# Patient Record
Sex: Female | Born: 1965 | Race: White | Hispanic: No | Smoking: Never smoker
Health system: Southern US, Community
[De-identification: ages and names within clinical notes are randomized; demographics above are authoritative.]

## PROBLEM LIST (undated history)

## (undated) DIAGNOSIS — N179 Acute kidney failure, unspecified: Secondary | ICD-10-CM

## (undated) DIAGNOSIS — K90829 Short bowel syndrome, unspecified: Secondary | ICD-10-CM

## (undated) DIAGNOSIS — F429 Obsessive-compulsive disorder, unspecified: Secondary | ICD-10-CM

## (undated) DIAGNOSIS — K439 Ventral hernia without obstruction or gangrene: Secondary | ICD-10-CM

## (undated) DIAGNOSIS — R748 Abnormal levels of other serum enzymes: Secondary | ICD-10-CM

## (undated) DIAGNOSIS — S31109A Unspecified open wound of abdominal wall, unspecified quadrant without penetration into peritoneal cavity, initial encounter: Secondary | ICD-10-CM

## (undated) DIAGNOSIS — K912 Postsurgical malabsorption, not elsewhere classified: Secondary | ICD-10-CM

## (undated) DIAGNOSIS — R768 Other specified abnormal immunological findings in serum: Secondary | ICD-10-CM

## (undated) DIAGNOSIS — K909 Intestinal malabsorption, unspecified: Secondary | ICD-10-CM

## (undated) DIAGNOSIS — K219 Gastro-esophageal reflux disease without esophagitis: Secondary | ICD-10-CM

## (undated) DIAGNOSIS — R519 Headache, unspecified: Secondary | ICD-10-CM

## (undated) DIAGNOSIS — Z8719 Personal history of other diseases of the digestive system: Secondary | ICD-10-CM

## (undated) DIAGNOSIS — N1330 Unspecified hydronephrosis: Secondary | ICD-10-CM

## (undated) DIAGNOSIS — G8929 Other chronic pain: Secondary | ICD-10-CM

## (undated) DIAGNOSIS — F319 Bipolar disorder, unspecified: Secondary | ICD-10-CM

## (undated) DIAGNOSIS — K56609 Unspecified intestinal obstruction, unspecified as to partial versus complete obstruction: Secondary | ICD-10-CM

## (undated) DIAGNOSIS — K5900 Constipation, unspecified: Secondary | ICD-10-CM

## (undated) DIAGNOSIS — F1190 Opioid use, unspecified, uncomplicated: Secondary | ICD-10-CM

## (undated) DIAGNOSIS — R0902 Hypoxemia: Secondary | ICD-10-CM

## (undated) DIAGNOSIS — K9083 Intestinal failure: Secondary | ICD-10-CM

## (undated) DIAGNOSIS — Z789 Other specified health status: Secondary | ICD-10-CM

## (undated) DIAGNOSIS — D649 Anemia, unspecified: Secondary | ICD-10-CM

## (undated) DIAGNOSIS — Z741 Need for assistance with personal care: Secondary | ICD-10-CM

## (undated) DIAGNOSIS — T782XXA Anaphylactic shock, unspecified, initial encounter: Secondary | ICD-10-CM

## (undated) DIAGNOSIS — G40909 Epilepsy, unspecified, not intractable, without status epilepticus: Secondary | ICD-10-CM

## (undated) DIAGNOSIS — D3610 Benign neoplasm of peripheral nerves and autonomic nervous system, unspecified: Secondary | ICD-10-CM

## (undated) DIAGNOSIS — E43 Unspecified severe protein-calorie malnutrition: Secondary | ICD-10-CM

## (undated) HISTORY — DX: Anaphylactic shock, unspecified, initial encounter: T78.2XXA

## (undated) HISTORY — DX: Obsessive-compulsive disorder, unspecified: F42.9

## (undated) HISTORY — DX: Unspecified open wound of abdominal wall, unspecified quadrant without penetration into peritoneal cavity, initial encounter: S31.109A

## (undated) HISTORY — DX: Other specified abnormal immunological findings in serum: R76.8

## (undated) HISTORY — DX: Other chronic pain: G89.29

## (undated) HISTORY — DX: Ventral hernia without obstruction or gangrene: K43.9

## (undated) HISTORY — DX: Intestinal malabsorption, unspecified: K90.9

## (undated) HISTORY — DX: Hypoxemia: R09.02

## (undated) HISTORY — DX: Benign neoplasm of peripheral nerves and autonomic nervous system, unspecified: D36.10

## (undated) HISTORY — DX: Headache, unspecified: R51.9

## (undated) HISTORY — DX: Short bowel syndrome, unspecified: K90.829

## (undated) HISTORY — DX: Bipolar disorder, unspecified: F31.9

## (undated) HISTORY — DX: Gastro-esophageal reflux disease without esophagitis: K21.9

## (undated) HISTORY — DX: Other specified health status: Z78.9

## (undated) HISTORY — DX: Unspecified hydronephrosis: N13.30

## (undated) HISTORY — DX: Unspecified severe protein-calorie malnutrition: E43

## (undated) HISTORY — DX: Constipation, unspecified: K59.00

## (undated) HISTORY — DX: Epilepsy, unspecified, not intractable, without status epilepticus: G40.909

## (undated) HISTORY — DX: Unspecified intestinal obstruction, unspecified as to partial versus complete obstruction: K56.609

## (undated) HISTORY — DX: Postsurgical malabsorption, not elsewhere classified: K91.2

## (undated) HISTORY — DX: Need for assistance with personal care: Z74.1

## (undated) HISTORY — DX: Acute kidney failure, unspecified: N17.9

## (undated) HISTORY — DX: Intestinal failure: K90.83

## (undated) HISTORY — DX: Opioid use, unspecified, uncomplicated: F11.90

## (undated) HISTORY — DX: Personal history of other diseases of the digestive system: Z87.19

## (undated) HISTORY — DX: Anemia, unspecified: D64.9

## (undated) HISTORY — DX: Abnormal levels of other serum enzymes: R74.8

---

## 2012-06-03 DIAGNOSIS — K912 Postsurgical malabsorption, not elsewhere classified: Secondary | ICD-10-CM | POA: Insufficient documentation

## 2012-06-03 DIAGNOSIS — F319 Bipolar disorder, unspecified: Secondary | ICD-10-CM | POA: Insufficient documentation

## 2012-06-03 DIAGNOSIS — K90829 Short bowel syndrome, unspecified: Secondary | ICD-10-CM | POA: Insufficient documentation

## 2012-07-16 DIAGNOSIS — Z8719 Personal history of other diseases of the digestive system: Secondary | ICD-10-CM | POA: Insufficient documentation

## 2012-08-22 DIAGNOSIS — F429 Obsessive-compulsive disorder, unspecified: Secondary | ICD-10-CM | POA: Insufficient documentation

## 2012-10-29 DIAGNOSIS — F1190 Opioid use, unspecified, uncomplicated: Secondary | ICD-10-CM | POA: Insufficient documentation

## 2014-09-27 DIAGNOSIS — D649 Anemia, unspecified: Secondary | ICD-10-CM | POA: Insufficient documentation

## 2014-10-20 DIAGNOSIS — G40909 Epilepsy, unspecified, not intractable, without status epilepticus: Secondary | ICD-10-CM | POA: Insufficient documentation

## 2015-03-12 DIAGNOSIS — R519 Headache, unspecified: Secondary | ICD-10-CM | POA: Insufficient documentation

## 2015-04-23 DIAGNOSIS — R7689 Other specified abnormal immunological findings in serum: Secondary | ICD-10-CM | POA: Insufficient documentation

## 2015-04-23 DIAGNOSIS — R768 Other specified abnormal immunological findings in serum: Secondary | ICD-10-CM | POA: Insufficient documentation

## 2015-05-19 DIAGNOSIS — T782XXA Anaphylactic shock, unspecified, initial encounter: Secondary | ICD-10-CM | POA: Insufficient documentation

## 2015-10-22 DIAGNOSIS — D3610 Benign neoplasm of peripheral nerves and autonomic nervous system, unspecified: Secondary | ICD-10-CM | POA: Insufficient documentation

## 2015-12-27 DIAGNOSIS — N1330 Unspecified hydronephrosis: Secondary | ICD-10-CM | POA: Insufficient documentation

## 2016-01-03 DIAGNOSIS — E43 Unspecified severe protein-calorie malnutrition: Secondary | ICD-10-CM | POA: Insufficient documentation

## 2018-01-08 DIAGNOSIS — K5900 Constipation, unspecified: Secondary | ICD-10-CM | POA: Insufficient documentation

## 2018-01-10 DIAGNOSIS — M545 Low back pain, unspecified: Secondary | ICD-10-CM | POA: Insufficient documentation

## 2018-01-10 DIAGNOSIS — G8929 Other chronic pain: Secondary | ICD-10-CM | POA: Insufficient documentation

## 2018-12-27 DIAGNOSIS — K909 Intestinal malabsorption, unspecified: Secondary | ICD-10-CM | POA: Insufficient documentation

## 2020-07-23 DIAGNOSIS — Z9049 Acquired absence of other specified parts of digestive tract: Secondary | ICD-10-CM | POA: Insufficient documentation

## 2020-07-23 DIAGNOSIS — Z789 Other specified health status: Secondary | ICD-10-CM | POA: Insufficient documentation

## 2020-07-28 DIAGNOSIS — R748 Abnormal levels of other serum enzymes: Secondary | ICD-10-CM | POA: Insufficient documentation

## 2020-07-28 DIAGNOSIS — Z4689 Encounter for fitting and adjustment of other specified devices: Secondary | ICD-10-CM | POA: Insufficient documentation

## 2020-08-29 DIAGNOSIS — S31109S Unspecified open wound of abdominal wall, unspecified quadrant without penetration into peritoneal cavity, sequela: Secondary | ICD-10-CM | POA: Insufficient documentation

## 2020-08-29 DIAGNOSIS — K439 Ventral hernia without obstruction or gangrene: Secondary | ICD-10-CM | POA: Insufficient documentation

## 2020-09-12 DIAGNOSIS — Z741 Need for assistance with personal care: Secondary | ICD-10-CM | POA: Insufficient documentation

## 2021-08-21 ENCOUNTER — Other Ambulatory Visit: Payer: Medicaid Other

## 2021-08-21 ENCOUNTER — Encounter: Payer: Medicaid Other | Admitting: Oncology

## 2021-08-28 ENCOUNTER — Encounter: Payer: Self-pay | Admitting: Oncology

## 2021-08-28 ENCOUNTER — Inpatient Hospital Stay: Payer: Medicaid Other | Attending: Oncology | Admitting: Oncology

## 2021-08-28 ENCOUNTER — Other Ambulatory Visit: Payer: Self-pay

## 2021-08-28 ENCOUNTER — Inpatient Hospital Stay: Payer: Medicaid Other

## 2021-08-28 VITALS — BP 98/68 | HR 72 | Temp 96.6°F | Resp 16 | Wt 100.1 lb

## 2021-08-28 DIAGNOSIS — Z86718 Personal history of other venous thrombosis and embolism: Secondary | ICD-10-CM

## 2021-08-28 DIAGNOSIS — I82623 Acute embolism and thrombosis of deep veins of upper extremity, bilateral: Secondary | ICD-10-CM

## 2021-08-28 DIAGNOSIS — K219 Gastro-esophageal reflux disease without esophagitis: Secondary | ICD-10-CM | POA: Insufficient documentation
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
## 2021-09-03 ENCOUNTER — Ambulatory Visit: Admission: RE | Admit: 2021-09-03 | Payer: Medicaid Other | Source: Ambulatory Visit

## 2021-09-11 ENCOUNTER — Ambulatory Visit: Payer: Medicaid Other

## 2021-09-16 ENCOUNTER — Telehealth: Payer: Medicaid Other | Admitting: Oncology

## 2021-09-16 ENCOUNTER — Ambulatory Visit: Admission: RE | Admit: 2021-09-16 | Payer: Medicaid Other | Source: Ambulatory Visit

## 2021-09-18 DIAGNOSIS — T829XXA Unspecified complication of cardiac and vascular prosthetic device, implant and graft, initial encounter: Secondary | ICD-10-CM | POA: Insufficient documentation

## 2021-09-26 ENCOUNTER — Telehealth: Payer: Medicaid Other | Admitting: Oncology

## 2021-09-26 ENCOUNTER — Encounter: Payer: Self-pay | Admitting: Oncology

## 2021-09-30 ENCOUNTER — Ambulatory Visit: Payer: Medicaid Other

## 2021-10-02 ENCOUNTER — Inpatient Hospital Stay: Payer: Medicaid Other | Admitting: Oncology

## 2021-10-03 ENCOUNTER — Ambulatory Visit: Payer: Medicaid Other | Attending: Oncology

## 2021-10-05 DIAGNOSIS — N12 Tubulo-interstitial nephritis, not specified as acute or chronic: Secondary | ICD-10-CM | POA: Insufficient documentation

## 2021-10-08 ENCOUNTER — Telehealth: Payer: Self-pay | Admitting: Oncology

## 2021-10-08 ENCOUNTER — Inpatient Hospital Stay: Payer: Medicaid Other | Attending: Oncology | Admitting: Oncology

## 2021-10-08 NOTE — Telephone Encounter (Signed)
Attempt made to reach patient about missed appointment today. No answer and the mailbox was full.

## 2021-10-25 ENCOUNTER — Telehealth: Payer: Self-pay | Admitting: Oncology

## 2021-10-25 NOTE — Telephone Encounter (Signed)
Received referral from patient's PCP. Attempt made to reach patient to schedule--left a VM with ext.

## 2021-10-25 NOTE — Telephone Encounter (Signed)
Primary MD office to ask that pt be set up for follow up appt. In next 2 weeks. Please cal pt at  [phone] They have faxed over paper work.

## 2021-10-28 ENCOUNTER — Encounter: Payer: Self-pay | Admitting: Oncology

## 2021-10-28 NOTE — Telephone Encounter (Signed)
Called again today and got voicemail. Left message that pt had several appts in the past and either pt cancelled for u/s and Md appt or 1 visit she was a no show for the u/s and Md visit. Also we got a ref. To see md from PCP. We are happy to make an appts if she can call and speak to [place] so that we get an appt date and time that she can make sure she will come. Left my direct number

## 2021-10-29 NOTE — Telephone Encounter (Signed)
Called pt today to see if I could get her on the phone to see if she is willing to come in and get u/s and see md. Her PCP sent our office a note that you need to be seen in 2 weeks if possible. Pt has missed appts at our office for appt and for u/s. Would like her to call us so we can set up appt and u/s that she is able to get it done. Left my telephone number

## 2021-10-30 NOTE — Telephone Encounter (Signed)
Pt called back today and was set up for [place] and md visit

## 2021-11-18 ENCOUNTER — Ambulatory Visit
Admission: RE | Admit: 2021-11-18 | Discharge: 2021-11-18 | Disposition: A | Discharge status: Home / Self Care | Payer: Medicaid Other | Source: Ambulatory Visit | Attending: Oncology | Admitting: Oncology

## 2021-11-18 ENCOUNTER — Other Ambulatory Visit: Payer: Self-pay

## 2021-11-18 DIAGNOSIS — I82623 Acute embolism and thrombosis of deep veins of upper extremity, bilateral: Secondary | ICD-10-CM | POA: Diagnosis present

## 2021-11-19 ENCOUNTER — Inpatient Hospital Stay: Payer: Medicaid Other | Attending: Nurse Practitioner | Admitting: Nurse Practitioner

## 2021-11-19 DIAGNOSIS — I82623 Acute embolism and thrombosis of deep veins of upper extremity, bilateral: Secondary | ICD-10-CM | POA: Diagnosis not present
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
## 2021-11-20 ENCOUNTER — Encounter: Payer: Self-pay | Admitting: Nurse Practitioner

## 2022-05-20 ENCOUNTER — Telehealth: Payer: Self-pay | Admitting: Oncology

## 2022-05-20 ENCOUNTER — Ambulatory Visit: Admission: RE | Admit: 2022-05-20 | Payer: Medicaid Other | Source: Ambulatory Visit

## 2022-05-20 NOTE — Telephone Encounter (Signed)
Notified by radiology that patient no/showed for her [place] today. This is the 5th time patient has cancelled or no/showed for imaging. Per NP recommendation, 6 month f/u appointment on 6/9 was cancelled. Left VM with patient and requested she give [place] a call back if she would like to get rescheduled at this time.

## 2022-05-20 NOTE — Telephone Encounter (Signed)
I tried calling also to see if I can get her. I left voicemail that she did not get US done so we are cancelling the NP appt for  6/9. Please call back if you would like to set this u/s and md appt in the future. Maybe you can give [place] dates that can work for you to come. Left my direct number

## 2022-05-23 ENCOUNTER — Inpatient Hospital Stay: Payer: Medicaid Other | Admitting: Nurse Practitioner

## 2023-10-04 IMAGING — US US EXTREM  UP VENOUS BILAT
1 series · 13 of 24 positions shown · non-contrast
Comparison: None.

CLINICAL DATA: History of bilateral upper extremity DVT.

EXAM:
BILATERAL UPPER EXTREMITY VENOUS DOPPLER ULTRASOUND
TECHNIQUE: Gray-scale sonography with graded compression, as well as color
Doppler and duplex ultrasound were performed to evaluate the
bilateral upper extremity deep venous systems from the level of the
subclavian vein and including the jugular, axillary, basilic,
radial, ulnar and upper cephalic vein. Spectral Doppler was utilized
to evaluate flow at rest and with distal augmentation maneuvers.

[Series 1: us venous img upper bilat (dvt) · portal-venous · 13 of 64 slices shown]
[im 1/64]
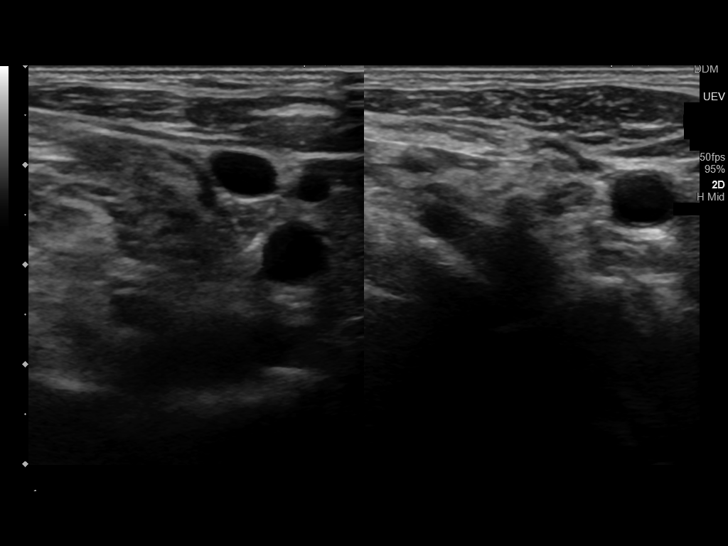
[im 6/64]
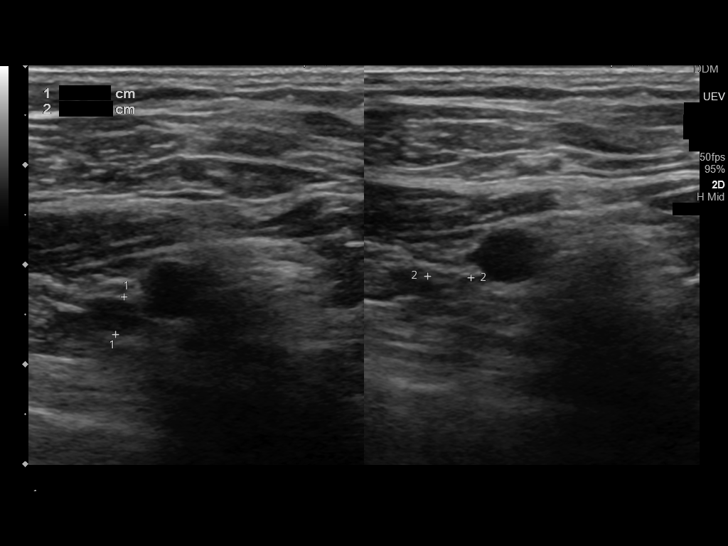
[im 11/64]
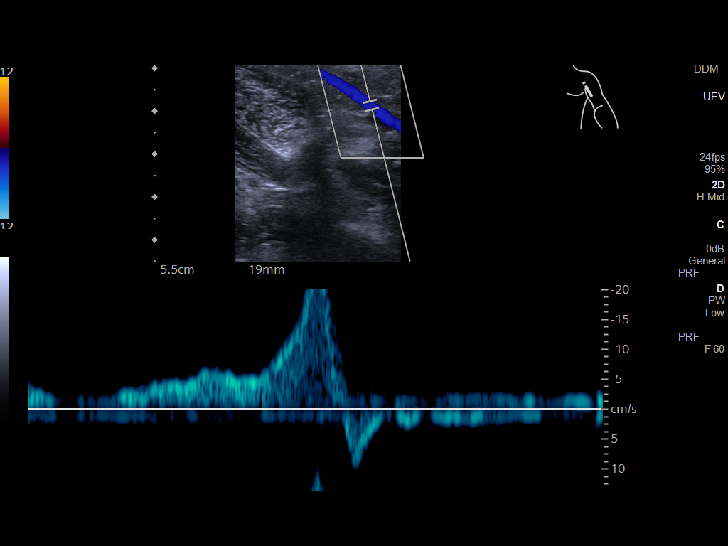
[im 17/64]
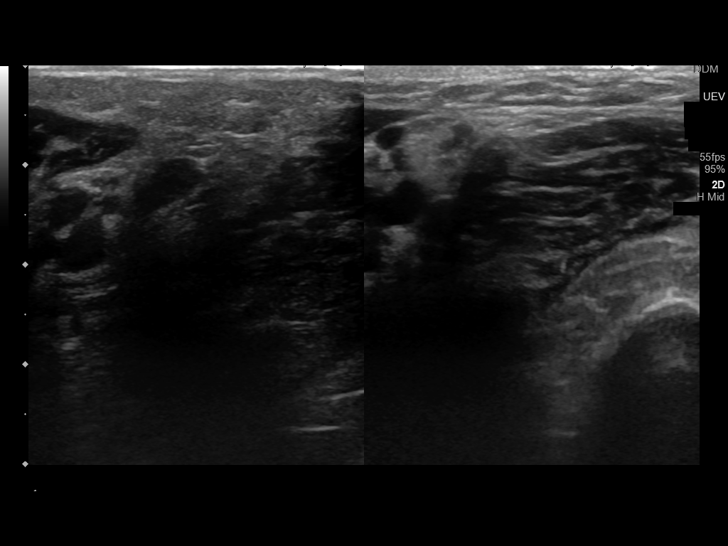
[im 22/64]
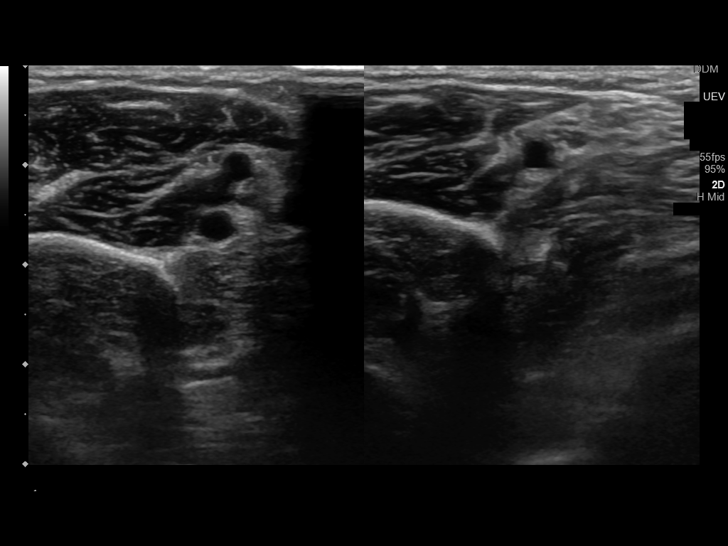
[im 28/64]
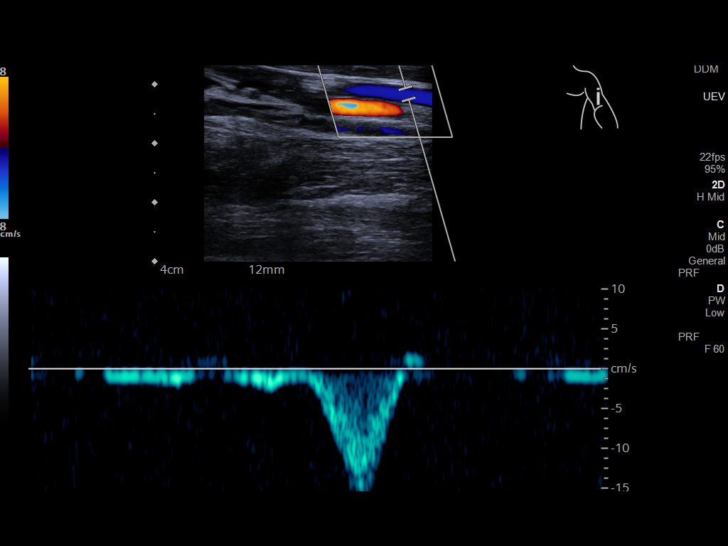
[im 33/64]
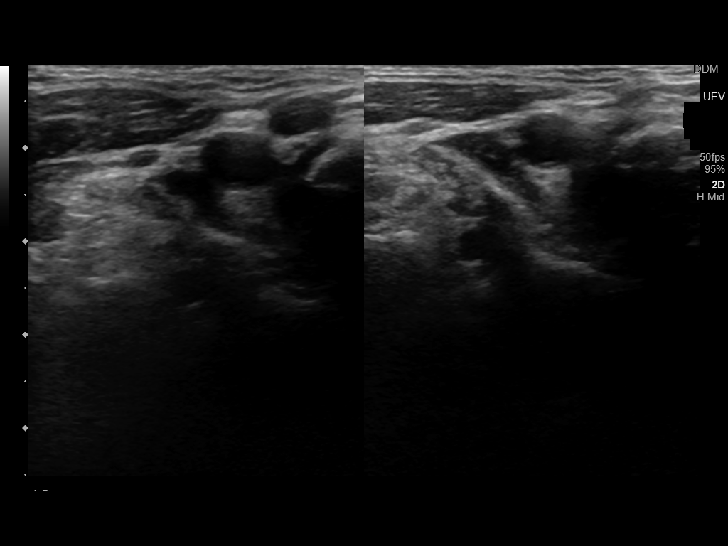
[im 36/64]
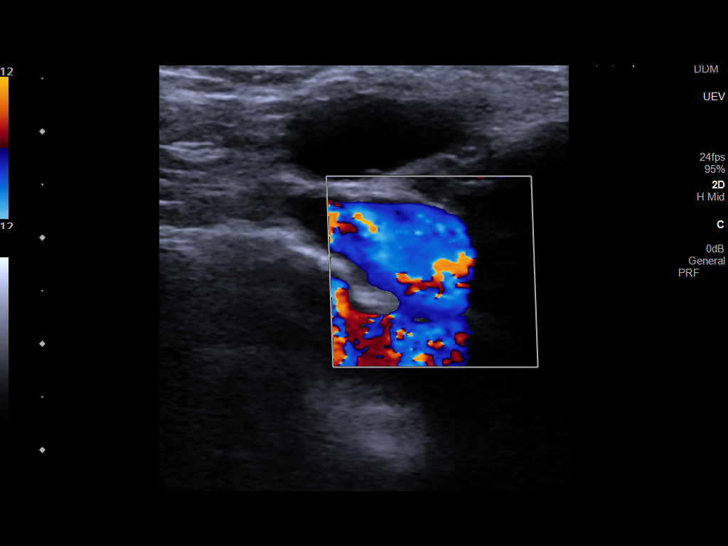
[im 42/64]
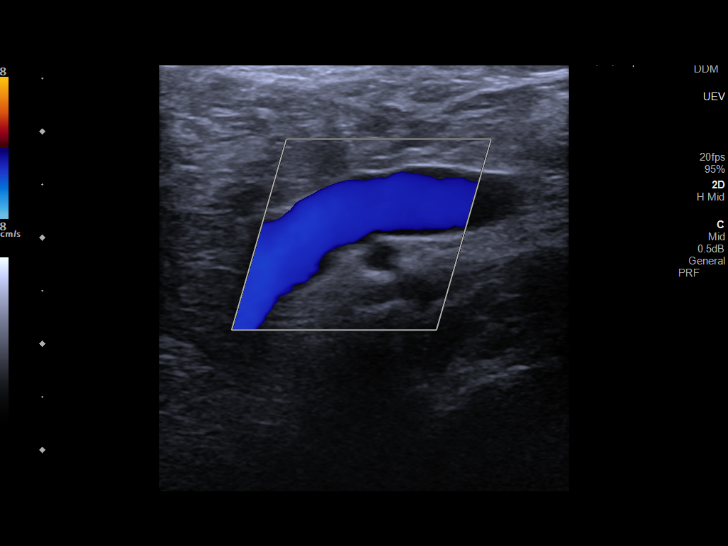
[im 47/64]
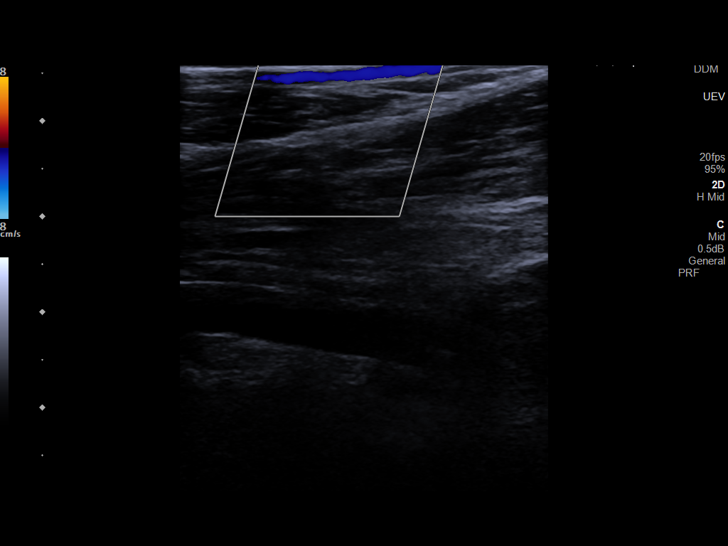
[im 53/64]
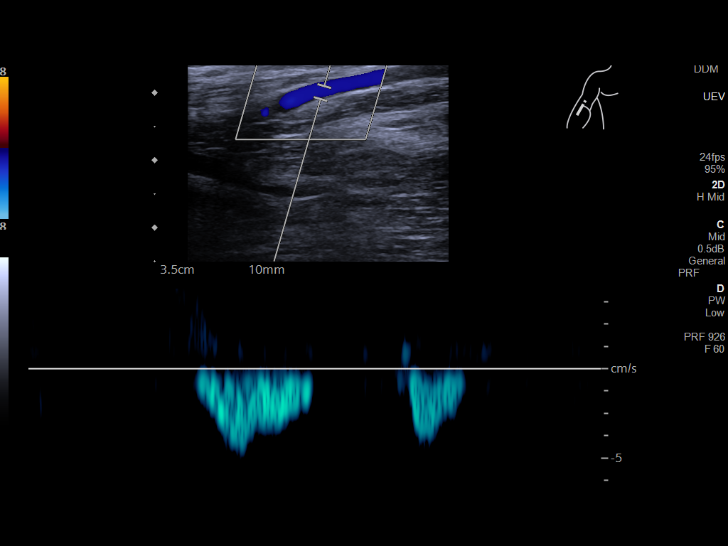
[im 58/64]
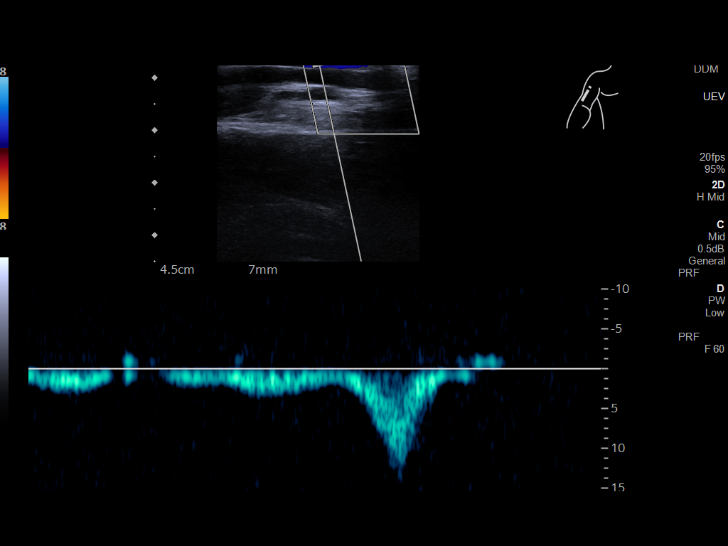
[im 64/64]
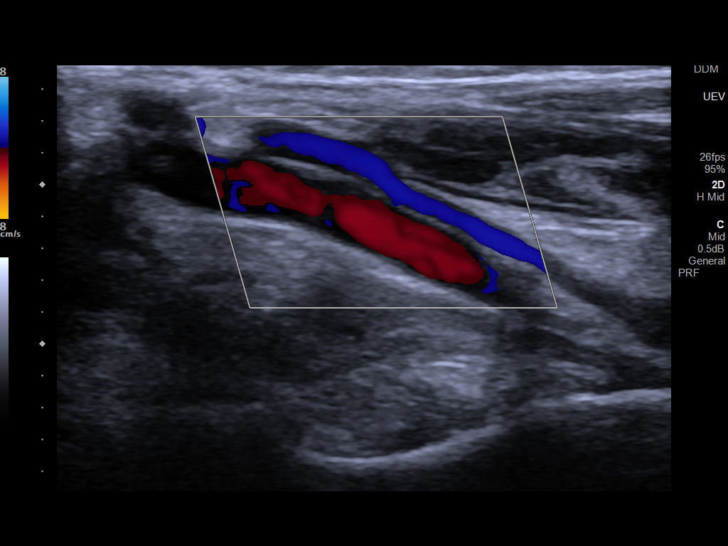

[13 of 24 positions shown; findings below may reference images not displayed]

FINDINGS: RIGHT UPPER EXTREMITY

Internal Jugular Vein: No evidence of thrombus. Normal
compressibility, respiratory phasicity and response to augmentation.

Subclavian Vein: No evidence of thrombus. Normal compressibility,
respiratory phasicity and response to augmentation.

Axillary Vein: No evidence of thrombus. Normal compressibility,
respiratory phasicity and response to augmentation.

Cephalic Vein: No evidence of thrombus. Normal compressibility,
respiratory phasicity and response to augmentation.

Basilic Vein: No evidence of thrombus. Normal compressibility,
respiratory phasicity and response to augmentation.

Brachial Veins: No evidence of thrombus. Normal compressibility,
respiratory phasicity and response to augmentation.

Radial Veins: No evidence of thrombus. Normal compressibility,
respiratory phasicity and response to augmentation.

Ulnar Veins: No evidence of thrombus. Normal compressibility,
respiratory phasicity and response to augmentation.

Venous Reflux:  None.

Other Findings:  None.

LEFT UPPER EXTREMITY

Internal Jugular Vein: No evidence of thrombus. Normal
compressibility, respiratory phasicity and response to augmentation.

Subclavian Vein: No evidence of thrombus. Normal compressibility,
respiratory phasicity and response to augmentation.

Axillary Vein: No evidence of thrombus. Normal compressibility,
respiratory phasicity and response to augmentation.

Cephalic Vein: No evidence of thrombus. Normal compressibility,
respiratory phasicity and response to augmentation.

Basilic Vein: No evidence of thrombus. Normal compressibility,
respiratory phasicity and response to augmentation.

Brachial Veins: No evidence of thrombus. Normal compressibility,
respiratory phasicity and response to augmentation.

Radial Veins: No evidence of thrombus. Normal compressibility,
respiratory phasicity and response to augmentation.

Ulnar Veins: No evidence of thrombus. Normal compressibility,
respiratory phasicity and response to augmentation.

Venous Reflux:  None.

Other Findings:  None.
IMPRESSION: No evidence of DVT within either upper extremity.
